# Patient Record
Sex: Male | Born: 2000 | Race: White | Hispanic: No | Marital: Single | Smoking: Current some day smoker
Health system: Southern US, Community
[De-identification: ages and names within clinical notes are randomized; demographics above are authoritative.]

## PROBLEM LIST (undated history)

## (undated) DIAGNOSIS — F902 Attention-deficit hyperactivity disorder, combined type: Secondary | ICD-10-CM

## (undated) DIAGNOSIS — R278 Other lack of coordination: Secondary | ICD-10-CM

## (undated) HISTORY — DX: Attention-deficit hyperactivity disorder, combined type: F90.2

## (undated) HISTORY — PX: TYMPANOSTOMY TUBE PLACEMENT: SHX32

## (undated) HISTORY — DX: Other lack of coordination: R27.8

---
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
## 2022-08-19 ENCOUNTER — Other Ambulatory Visit: Payer: Self-pay

## 2022-08-19 ENCOUNTER — Emergency Department (HOSPITAL_COMMUNITY): Payer: 59 | Admitting: Anesthesiology

## 2022-08-19 ENCOUNTER — Observation Stay (HOSPITAL_BASED_OUTPATIENT_CLINIC_OR_DEPARTMENT_OTHER)
Admission: EM | Admit: 2022-08-19 | Discharge: 2022-08-20 | Disposition: A | Discharge status: Home / Self Care | Payer: 59 | Attending: General Surgery | Admitting: General Surgery

## 2022-08-19 ENCOUNTER — Emergency Department (HOSPITAL_BASED_OUTPATIENT_CLINIC_OR_DEPARTMENT_OTHER): Payer: 59

## 2022-08-19 DIAGNOSIS — R1031 Right lower quadrant pain: Secondary | ICD-10-CM | POA: Diagnosis present

## 2022-08-19 DIAGNOSIS — K353 Acute appendicitis with localized peritonitis, without perforation or gangrene: Secondary | ICD-10-CM | POA: Diagnosis not present

## 2022-08-19 DIAGNOSIS — K3580 Unspecified acute appendicitis: Secondary | ICD-10-CM | POA: Diagnosis present

## 2022-08-19 LAB — CBC
HCT: 41.6 % (ref 39.0–52.0)
HCT: 41 % (ref 39.0–52.0)
Hemoglobin: 14.6 g/dL (ref 13.0–17.0)
Hemoglobin: 14.3 g/dL (ref 13.0–17.0)
MCH: 29.9 pg (ref 26.0–34.0)
MCH: 29.6 pg (ref 26.0–34.0)
MCHC: 35.1 g/dL (ref 30.0–36.0)
MCHC: 34.9 g/dL (ref 30.0–36.0)
MCV: 85.2 fL (ref 80.0–100.0)
MCV: 84.9 fL (ref 80.0–100.0)
Platelets: 307 10*3/uL (ref 150–400)
Platelets: 301 10*3/uL (ref 150–400)
RBC: 4.88 MIL/uL (ref 4.22–5.81)
RBC: 4.83 MIL/uL (ref 4.22–5.81)
RDW: 11.7 % (ref 11.5–15.5)
RDW: 11.7 % (ref 11.5–15.5)
WBC: 12.1 10*3/uL — ABNORMAL HIGH (ref 4.0–10.5)
WBC: 9.8 10*3/uL (ref 4.0–10.5)
nRBC: 0 % (ref 0.0–0.2)
nRBC: 0 % (ref 0.0–0.2)

## 2022-08-19 LAB — COMPREHENSIVE METABOLIC PANEL
ALT: 14 U/L (ref 0–44)
AST: 17 U/L (ref 15–41)
Albumin: 5 g/dL (ref 3.5–5.0)
Alkaline Phosphatase: 64 U/L (ref 38–126)
Anion gap: 10 (ref 5–15)
BUN: 15 mg/dL (ref 6–20)
CO2: 26 mmol/L (ref 22–32)
Calcium: 9.9 mg/dL (ref 8.9–10.3)
Chloride: 101 mmol/L (ref 98–111)
Creatinine, Ser: 1.05 mg/dL (ref 0.61–1.24)
GFR, Estimated: >60 mL/min (ref >60)
Glucose, Bld: 94 mg/dL (ref 70–99)
Potassium: 3.8 mmol/L (ref 3.5–5.1)
Sodium: 137 mmol/L (ref 135–145)
Total Bilirubin: 0.8 mg/dL (ref 0.3–1.2)
Total Protein: 7.8 g/dL (ref 6.5–8.1)

## 2022-08-19 LAB — URINALYSIS, ROUTINE W REFLEX MICROSCOPIC
Bilirubin Urine: NEGATIVE
Glucose, UA: NEGATIVE mg/dL
Hgb urine dipstick: NEGATIVE
Ketones, ur: NEGATIVE mg/dL
Leukocytes,Ua: NEGATIVE
Nitrite: NEGATIVE
Protein, ur: NEGATIVE mg/dL
Specific Gravity, Urine: 1.017 (ref 1.005–1.030)
pH: 7 (ref 5.0–8.0)

## 2022-08-19 LAB — CREATININE, SERUM
Creatinine, Ser: 1.1 mg/dL (ref 0.61–1.24)
GFR, Estimated: >60 mL/min (ref >60)

## 2022-08-19 LAB — LIPASE, BLOOD: Lipase: <10 U/L — ABNORMAL LOW (ref 11–51)

## 2022-08-19 LAB — HIV ANTIBODY (ROUTINE TESTING W REFLEX): HIV Screen 4th Generation wRfx: NONREACTIVE

## 2022-08-19 MED ORDER — SODIUM CHLORIDE 0.9 % IV SOLN
2.0000 g | Freq: Once | INTRAVENOUS | Status: AC
  Administered 2022-08-19: 2 g via INTRAVENOUS
  Filled 2022-08-19: qty 20

## 2022-08-19 MED ORDER — SODIUM CHLORIDE 0.9 % IV SOLN: 2.0000 g | INTRAVENOUS | Status: DC

## 2022-08-19 MED ORDER — IOHEXOL 300 MG/ML  SOLN
100.0000 mL | Freq: Once | INTRAMUSCULAR | Status: AC | PRN
  Administered 2022-08-19: 80 mL via INTRAVENOUS

## 2022-08-19 MED ORDER — ONDANSETRON HCL 4 MG/2ML IJ SOLN: 4.0000 mg | Freq: Three times a day (TID) | INTRAMUSCULAR | Status: DC | PRN

## 2022-08-19 MED ORDER — METOPROLOL TARTRATE 5 MG/5ML IV SOLN: 5.0000 mg | Freq: Four times a day (QID) | INTRAVENOUS | Status: DC | PRN

## 2022-08-19 MED ORDER — ONDANSETRON HCL 4 MG/2ML IJ SOLN
4.0000 mg | Freq: Four times a day (QID) | INTRAMUSCULAR | Status: DC | PRN
  Administered 2022-08-19: 4 mg via INTRAVENOUS
  Filled 2022-08-19: qty 2

## 2022-08-19 MED ORDER — ONDANSETRON 4 MG PO TBDP: 4.0000 mg | ORAL_TABLET | Freq: Four times a day (QID) | ORAL | Status: DC | PRN

## 2022-08-19 MED ORDER — HYDROMORPHONE HCL 1 MG/ML IJ SOLN
0.5000 mg | INTRAMUSCULAR | Status: AC | PRN
  Administered 2022-08-19: 0.5 mg via INTRAVENOUS
  Filled 2022-08-19: qty 1

## 2022-08-19 MED ORDER — METRONIDAZOLE 500 MG/100ML IV SOLN
500.0000 mg | Freq: Two times a day (BID) | INTRAVENOUS | Status: DC
  Administered 2022-08-20: 500 mg via INTRAVENOUS
  Filled 2022-08-19: qty 100

## 2022-08-19 MED ORDER — ENOXAPARIN SODIUM 40 MG/0.4ML IJ SOSY
40.0000 mg | PREFILLED_SYRINGE | INTRAMUSCULAR | Status: DC
  Administered 2022-08-19: 40 mg via SUBCUTANEOUS
  Filled 2022-08-19: qty 0.4

## 2022-08-19 MED ORDER — OXYCODONE HCL 5 MG PO TABS
5.0000 mg | ORAL_TABLET | Freq: Four times a day (QID) | ORAL | Status: DC | PRN
  Administered 2022-08-19: 5 mg via ORAL
  Filled 2022-08-19: qty 1

## 2022-08-19 MED ORDER — POLYETHYLENE GLYCOL 3350 17 G PO PACK: 17.0000 g | PACK | Freq: Every day | ORAL | Status: DC | PRN

## 2022-08-19 MED ORDER — ACETAMINOPHEN 325 MG PO TABS: 650.0000 mg | ORAL_TABLET | Freq: Four times a day (QID) | ORAL | Status: DC | PRN

## 2022-08-19 MED ORDER — METRONIDAZOLE 500 MG/100ML IV SOLN
500.0000 mg | Freq: Once | INTRAVENOUS | Status: AC
  Administered 2022-08-19: 500 mg via INTRAVENOUS
  Filled 2022-08-19: qty 100

## 2022-08-19 MED ORDER — MORPHINE SULFATE (PF) 2 MG/ML IV SOLN: 2.0000 mg | INTRAVENOUS | Status: DC | PRN

## 2022-08-19 MED ORDER — ACETAMINOPHEN 650 MG RE SUPP: 650.0000 mg | Freq: Four times a day (QID) | RECTAL | Status: DC | PRN

## 2022-08-19 MED ORDER — KCL IN DEXTROSE-NACL 20-5-0.45 MEQ/L-%-% IV SOLN
INTRAVENOUS | Status: DC
  Filled 2022-08-19: qty 1000

## 2022-08-19 NOTE — H&P (Signed)
Reason for Consult:abdominal pain Referring Provider: Rhea Bleacher  Thomas [name] is an 22 y.o. male.  HPI: 22 yo male with 1 day of abdominal pain. Pain started in the LUQ and localized to the right lower quadrant. Pain is improved with pain medication. He has vomiting earlier today. He had a few nuggets 2 hours ago.  Past Medical History:  Diagnosis Date   ADHD (attention deficit hyperactivity disorder), combined type 05/23/2015   Dysgraphia 05/23/2015    Past Surgical History:  Procedure Laterality Date   MOUTH SURGERY  Jan 2017   Oral sugery for braces, pulling up right lower canine   TYMPANOSTOMY TUBE PLACEMENT      Family History  Problem Relation Age of Onset   Hypertension Mother    Anxiety disorder Mother    ADD / ADHD Mother    ADD / ADHD Father    Hyperlipidemia Father    Hypertension Maternal Grandfather    Hypertension Paternal Grandfather     Social History:  reports that he has never smoked. He has never been exposed to tobacco smoke. He has never used smokeless tobacco. He reports current alcohol use of about 2.0 standard drinks of alcohol per week. He reports that he does not currently use drugs after having used the following drugs: Marijuana.  Allergies: No Known Allergies  Medications: I have reviewed the patient's current medications.  Results for orders placed or performed during the hospital encounter of 08/19/22 (from the past 48 hour(s))  Lipase, blood     Status: Abnormal   Collection Time: 08/19/22  3:51 PM  Result Value Ref Range   Lipase <10 (L) 11 - 51 U/L    Comment: Performed at [facility], [address], [place], [place] [address]  Comprehensive metabolic panel     Status: None   Collection Time: 08/19/22  3:51 PM  Result Value Ref Range   Sodium 137 135 - 145 mmol/L   Potassium 3.8 3.5 - 5.1 mmol/L   Chloride 101 98 - 111 mmol/L   CO2 26 22 - 32 mmol/L   Glucose, Bld 94 70 - 99 mg/dL    Comment: Glucose  reference range applies only to samples taken after fasting for at least 8 hours.   BUN 15 6 - 20 mg/dL   Creatinine, Ser 6.04 0.61 - 1.24 mg/dL   Calcium 9.9 8.9 - 54.0 mg/dL   Total Protein 7.8 6.5 - 8.1 g/dL   Albumin 5.0 3.5 - 5.0 g/dL   AST 17 15 - 41 U/L   ALT 14 0 - 44 U/L   Alkaline Phosphatase 64 38 - 126 U/L   Total Bilirubin 0.8 0.3 - 1.2 mg/dL   GFR, Estimated >98 >11 mL/min    Comment: (NOTE) Calculated using the CKD-EPI Creatinine Equation (2021)    Anion gap 10 5 - 15    Comment: Performed at [facility], [address], [place], [place] [address]  CBC     Status: Abnormal   Collection Time: 08/19/22  3:51 PM  Result Value Ref Range   WBC 12.1 (H) 4.0 - 10.5 K/uL   RBC 4.88 4.22 - 5.81 MIL/uL   Hemoglobin 14.6 13.0 - 17.0 g/dL   HCT 29.5 62.1 - 30.8 %   MCV 85.2 80.0 - 100.0 fL   MCH 29.9 26.0 - 34.0 pg   MCHC 35.1 30.0 - 36.0 g/dL   RDW 65.7 84.6 - 96.2 %   Platelets 307 150 - 400  K/uL   nRBC 0.0 0.0 - 0.2 %    Comment: Performed at [facility], [address] [place], [place] [address]  Urinalysis, Routine w reflex microscopic -Urine, Clean Catch     Status: None   Collection Time: 08/19/22  5:10 PM  Result Value Ref Range   Color, Urine YELLOW YELLOW   APPearance CLEAR CLEAR   Specific Gravity, Urine 1.017 1.005 - 1.030   pH 7.0 5.0 - 8.0   Glucose, UA NEGATIVE NEGATIVE mg/dL   Hgb urine dipstick NEGATIVE NEGATIVE   Bilirubin Urine NEGATIVE NEGATIVE   Ketones, ur NEGATIVE NEGATIVE mg/dL   Protein, ur NEGATIVE NEGATIVE mg/dL   Nitrite NEGATIVE NEGATIVE   Leukocytes,Ua NEGATIVE NEGATIVE    Comment: Performed at [facility], [address], [place], [place] [address]    CT ABDOMEN PELVIS W CONTRAST  Result Date: 08/19/2022 CLINICAL DATA:  Right lower quadrant abdominal pain. EXAM: CT ABDOMEN AND PELVIS WITH CONTRAST TECHNIQUE: Multidetector CT imaging of the abdomen and pelvis was performed  using the standard protocol following bolus administration of intravenous contrast. RADIATION DOSE REDUCTION: This exam was performed according to the departmental dose-optimization program which includes automated exposure control, adjustment of the mA and/or kV according to patient size and/or use of iterative reconstruction technique. CONTRAST:  80mL OMNIPAQUE IOHEXOL 300 MG/ML  SOLN COMPARISON:  None Available. FINDINGS: Lower chest: The visualized lung bases are clear. No intra-abdominal free air.  Small free fluid within the pelvis. Hepatobiliary: No focal liver abnormality is seen. No gallstones, gallbladder wall thickening, or biliary dilatation. Pancreas: Unremarkable. No pancreatic ductal dilatation or surrounding inflammatory changes. Spleen: Normal in size without focal abnormality. Adrenals/Urinary Tract: The adrenal glands, kidneys, visualized ureters, and urinary bladder appear unremarkable. Stomach/Bowel: There is no bowel obstruction. The appendix is mildly enlarged and inflamed measuring 11 mm in diameter. There is enhancement of the appendiceal mucosa. The appendix is located in the right lower quadrant medial and inferior to the cecum. No drainable fluid collection/abscess. No evidence of perforation. Vascular/Lymphatic: The abdominal aorta and IVC are unremarkable. No portal venous gas. Mildly enlarged right lower quadrant/pericecal lymph nodes, reactive. Reproductive: The prostate and seminal vesicles are grossly unremarkable. No pelvic mass. Other: None Musculoskeletal: Bilateral L4 pars defects. No listhesis. No acute osseous pathology. IMPRESSION: Acute appendicitis. No drainable fluid collection/abscess. Electronically Signed   By: Elgie Collard M.D.   On: 08/19/2022 18:24    Review of Systems  Constitutional: Negative.   HENT: Negative.    Eyes: Negative.   Respiratory: Negative.    Cardiovascular: Negative.   Gastrointestinal:  Positive for abdominal pain, nausea and vomiting.   Genitourinary: Negative.   Musculoskeletal: Negative.   Skin: Negative.   Neurological: Negative.   Endo/Heme/Allergies: Negative.   Psychiatric/Behavioral: Negative.      PE Blood pressure (!) 142/93, pulse 88, temperature 98.6 F (37 C), temperature source Oral, resp. rate 14, height 5\' 9"  (1.753 m), weight 81.2 kg, SpO2 100 %. Constitutional: NAD; conversant; no deformities Eyes: Moist conjunctiva; no lid lag; anicteric; PERRL Neck: Trachea midline; no thyromegaly Lungs: Normal respiratory effort; no tactile fremitus CV: RRR; no palpable thrills; no pitting edema GI: Abd soft, TTP RLQ; no palpable hepatosplenomegaly MSK: Normal gait; no clubbing/cyanosis Psychiatric: Appropriate affect; alert and oriented x3 Lymphatic: No palpable cervical or axillary lymphadenopathy Skin: No major subcutaneous nodules. Warm and dry   Assessment/Plan: 21 yo male with acute appendicitis -IV abx -pain control -Npo after midnight -OR in the morning -we  discussed the details of the procedure; that it would be done under general anesthesia, that we would attempt to do the procedure laparoscopically. That the appendix would be isolated from the large and small intestine and then ligated and removed. We discussed the reason for this is to avoid rupture and infection and resolve the pains. We discussed risks of infection, abscess, injury to intestines or urinary structures, and need for open incision. He showed good understanding and wanted to proceed.   I reviewed last 24 h vitals and pain scores, last 48 h intake and output, last 24 h labs and trends, and last 24 h imaging results.  This care required high  level of medical decision making.   De Blanch Allah Reason 08/19/2022, 8:39 PM

## 2022-08-19 NOTE — Progress Notes (Signed)
Patient brought to PACU for pre-op, surgery cancelled due to patient not being NPO. Report called to 6N and patient transported to room.

## 2022-08-19 NOTE — Anesthesia Preprocedure Evaluation (Signed)
Anesthesia Evaluation    Reviewed: Allergy & Precautions, Patient's Chart, lab work & pertinent test results, Unable to perform ROS - Chart review only  Airway        Dental   Pulmonary neg pulmonary ROS          Cardiovascular negative cardio ROS      Neuro/Psych  PSYCHIATRIC DISORDERS      negative neurological ROS     GI/Hepatic negative GI ROS, Neg liver ROS,,,  Endo/Other  negative endocrine ROS    Renal/GU negative Renal ROS     Musculoskeletal negative musculoskeletal ROS (+)    Abdominal   Peds  Hematology negative hematology ROS (+)   Anesthesia Other Findings   Reproductive/Obstetrics                             Anesthesia Physical Anesthesia Plan  ASA: 2 and emergent  Anesthesia Plan: General   Post-op Pain Management: Ofirmev IV (intra-op)*   Induction: Intravenous, Rapid sequence and Cricoid pressure planned  PONV Risk Score and Plan: 4 or greater and Ondansetron, Dexamethasone, Midazolam and Treatment may vary due to age or medical condition  Airway Management Planned: Oral ETT  Additional Equipment:   Intra-op Plan:   Post-operative Plan: Extubation in OR  Informed Consent:   Plan Discussed with:   Anesthesia Plan Comments:        Anesthesia Quick Evaluation

## 2022-08-19 NOTE — ED Notes (Signed)
Paged Dr. Sheliah Hatch to [place] [facility]

## 2022-08-19 NOTE — ED Notes (Signed)
Called [facility] to transport patient to [facility] 6N rm# [id number]

## 2022-08-19 NOTE — ED Provider Notes (Signed)
[facility] EMERGENCY DEPARTMENT AT [facility] Provider Note   CSN: [id number] Arrival date & time: 08/19/22  1529     History  Chief Complaint  Patient presents with   Abdominal Pain    Thomas [name] is a 22 y.o. male.  Patient with no significant past medical history, no past surgical history presents to the emergency department for evaluation of right lower quadrant pain.  Symptoms began yesterday.  Initially had pain and was and was supraumbilical.  He had nausea and vomiting.  Pain gradually worsened during the day yesterday.  He states that the pain moved towards the bellybutton down towards the right lower quadrant earlier today.  He has not been vomiting.  Last ate around 10 AM.  Decreased appetite today.  No fevers.  He has had some constipation, no diarrhea.  No blood in the stool.  No urinary symptoms.       Home Medications Prior to Admission medications   Medication Sig Start Date End Date Taking? Authorizing Provider  Amphetamine Sulfate (EVEKEO) 10 MG TABS Take 10-20 mg by mouth daily as needed (Classes and work). 02/22/21   Leticia Penna, NP      Allergies    Patient has no known allergies.    Review of Systems   Review of Systems  Physical Exam Updated Vital Signs BP (!) 142/93 (BP Location: Right Arm)   Pulse 88   Temp 98.6 F (37 C) (Oral)   Resp 14   Ht 5\' 9"  (1.753 m)   Wt 81.2 kg   SpO2 100%   BMI 26.43 kg/m  Physical Exam Vitals and nursing note reviewed.  Constitutional:      General: He is not in acute distress.    Appearance: He is well-developed.  HENT:     Head: Normocephalic and atraumatic.  Eyes:     General:        Right eye: No discharge.        Left eye: No discharge.     Conjunctiva/sclera: Conjunctivae normal.  Cardiovascular:     Rate and Rhythm: Normal rate and regular rhythm.     Heart sounds: Normal heart sounds.  Pulmonary:     Effort: Pulmonary effort is normal.     Breath sounds: Normal breath sounds.   Abdominal:     Palpations: Abdomen is soft.     Tenderness: There is abdominal tenderness (Moderate right lower quadrant tenderness). There is no guarding or rebound. Positive signs include Rovsing's sign and McBurney's sign. Negative signs include Murphy's sign.  Musculoskeletal:     Cervical back: Normal range of motion and neck supple.  Skin:    General: Skin is warm and dry.  Neurological:     Mental Status: He is alert.     ED Results / Procedures / Treatments   Labs (all labs ordered are listed, but only abnormal results are displayed) Labs Reviewed  LIPASE, BLOOD - Abnormal; Notable for the following components:      Result Value   Lipase <10 (*)    All other components within normal limits  CBC - Abnormal; Notable for the following components:   WBC 12.1 (*)    All other components within normal limits  COMPREHENSIVE METABOLIC PANEL  URINALYSIS, ROUTINE W REFLEX MICROSCOPIC    EKG None  Radiology CT ABDOMEN PELVIS W CONTRAST  Result Date: 08/19/2022 CLINICAL DATA:  Right lower quadrant abdominal pain. EXAM: CT ABDOMEN AND PELVIS WITH CONTRAST TECHNIQUE: Multidetector CT imaging of  the abdomen and pelvis was performed using the standard protocol following bolus administration of intravenous contrast. RADIATION DOSE REDUCTION: This exam was performed according to the departmental dose-optimization program which includes automated exposure control, adjustment of the mA and/or kV according to patient size and/or use of iterative reconstruction technique. CONTRAST:  80mL OMNIPAQUE IOHEXOL 300 MG/ML  SOLN COMPARISON:  None Available. FINDINGS: Lower chest: The visualized lung bases are clear. No intra-abdominal free air.  Small free fluid within the pelvis. Hepatobiliary: No focal liver abnormality is seen. No gallstones, gallbladder wall thickening, or biliary dilatation. Pancreas: Unremarkable. No pancreatic ductal dilatation or surrounding inflammatory changes. Spleen: Normal  in size without focal abnormality. Adrenals/Urinary Tract: The adrenal glands, kidneys, visualized ureters, and urinary bladder appear unremarkable. Stomach/Bowel: There is no bowel obstruction. The appendix is mildly enlarged and inflamed measuring 11 mm in diameter. There is enhancement of the appendiceal mucosa. The appendix is located in the right lower quadrant medial and inferior to the cecum. No drainable fluid collection/abscess. No evidence of perforation. Vascular/Lymphatic: The abdominal aorta and IVC are unremarkable. No portal venous gas. Mildly enlarged right lower quadrant/pericecal lymph nodes, reactive. Reproductive: The prostate and seminal vesicles are grossly unremarkable. No pelvic mass. Other: None Musculoskeletal: Bilateral L4 pars defects. No listhesis. No acute osseous pathology. IMPRESSION: Acute appendicitis. No drainable fluid collection/abscess. Electronically Signed   By: Elgie Collard M.D.   On: 08/19/2022 18:24    Procedures Procedures    Medications Ordered in ED Medications  cefTRIAXone (ROCEPHIN) 2 g in sodium chloride 0.9 % 100 mL IVPB (2 g Intravenous New Bag/Given 08/19/22 1849)    And  metroNIDAZOLE (FLAGYL) IVPB 500 mg (has no administration in time range)  HYDROmorphone (DILAUDID) injection 0.5 mg (has no administration in time range)  ondansetron (ZOFRAN) injection 4 mg (has no administration in time range)  iohexol (OMNIPAQUE) 300 MG/ML solution 100 mL (80 mLs Intravenous Contrast Given 08/19/22 1737)    ED Course/ Medical Decision Making/ A&P    Patient seen and examined. History obtained directly from patient. Work-up including labs, imaging, EKG ordered in triage, if performed, were reviewed.    Labs/EKG: Independently reviewed and interpreted.  This included: CBC with elevated white blood cell count at 12,000 otherwise unremarkable; CMP unremarkable; lipase normal; UA without compelling signs of infection.  Imaging: CT ordered and  pending  Medications/Fluids: Patient offered and declined pain medication  Most recent vital signs reviewed and are as follows: BP (!) 142/93 (BP Location: Right Arm)   Pulse 88   Temp 98.6 F (37 C) (Oral)   Resp 14   Ht 5\' 9"  (1.753 m)   Wt 81.2 kg   SpO2 100%   BMI 26.43 kg/m   Initial impression: Right lower quadrant pain, moderate to high concern for acute appendicitis   6:51 PM Reassessment performed. Patient appears stable.  Imaging personally visualized and interpreted including: CT of the abdomen pelvis, agree acute uncomplicated appendicitis  Reviewed pertinent lab work and imaging with patient at bedside. Questions answered.   Most current vital signs reviewed and are as follows: BP (!) 142/93 (BP Location: Right Arm)   Pulse 88   Temp 98.6 F (37 C) (Oral)   Resp 14   Ht 5\' 9"  (1.753 m)   Wt 81.2 kg   SpO2 100%   BMI 26.43 kg/m   Plan: Admit to hospital.   I discussed case with Dr. Sheliah Hatch, general surgery.  States that surgery won't be until morning.  Request n.p.o.  after midnight, IV antibiotics.                             Medical Decision Making Amount and/or Complexity of Data Reviewed Labs: ordered. Radiology: ordered.  Risk Prescription drug management. Decision regarding hospitalization.   For this patient's complaint of abdominal pain, the following conditions were considered on the differential diagnosis: gastritis/PUD, enteritis/duodenitis, appendicitis, cholelithiasis/cholecystitis, cholangitis, pancreatitis, ruptured viscus, colitis, diverticulitis, small/large bowel obstruction, proctitis, cystitis, pyelonephritis, ureteral colic, aortic dissection, aortic aneurysm. Atypical chest etiologies were also considered including ACS, PE, and pneumonia.         Final Clinical Impression(s) / ED Diagnoses Final diagnoses:  Acute appendicitis with localized peritonitis, without perforation, abscess, or gangrene    Rx / DC  Orders ED Discharge Orders     None         Renne Crigler, PA-C 08/19/22 1853    Vanetta Mulders, MD 08/22/22 1400

## 2022-08-19 NOTE — ED Triage Notes (Signed)
A+Ox4. Ambulatory to triage.  Reports abd pain starting last night. N/V- given zofran by urgent care PTA. No flank pain. Tenderness in both LQ-bloated feeling. Last BM this AM. No urinary complaints. Denies CP, SOB.  Denies medical HX.

## 2022-08-20 ENCOUNTER — Observation Stay (HOSPITAL_BASED_OUTPATIENT_CLINIC_OR_DEPARTMENT_OTHER): Payer: 59 | Admitting: Anesthesiology

## 2022-08-20 ENCOUNTER — Encounter (HOSPITAL_COMMUNITY): Payer: Self-pay

## 2022-08-20 ENCOUNTER — Encounter (HOSPITAL_COMMUNITY): Admission: EM | Disposition: A | Discharge status: Home / Self Care | Payer: Self-pay | Source: Home / Self Care

## 2022-08-20 ENCOUNTER — Observation Stay (HOSPITAL_COMMUNITY): Payer: 59 | Admitting: Anesthesiology

## 2022-08-20 DIAGNOSIS — F17210 Nicotine dependence, cigarettes, uncomplicated: Secondary | ICD-10-CM

## 2022-08-20 DIAGNOSIS — F909 Attention-deficit hyperactivity disorder, unspecified type: Secondary | ICD-10-CM | POA: Diagnosis not present

## 2022-08-20 DIAGNOSIS — K3580 Unspecified acute appendicitis: Secondary | ICD-10-CM

## 2022-08-20 HISTORY — PX: LAPAROSCOPIC APPENDECTOMY: SHX408

## 2022-08-20 LAB — CBC
HCT: 39.5 % (ref 39.0–52.0)
Hemoglobin: 13.8 g/dL (ref 13.0–17.0)
MCH: 30.4 pg (ref 26.0–34.0)
MCHC: 34.9 g/dL (ref 30.0–36.0)
MCV: 87 fL (ref 80.0–100.0)
Platelets: 282 10*3/uL (ref 150–400)
RBC: 4.54 MIL/uL (ref 4.22–5.81)
RDW: 11.7 % (ref 11.5–15.5)
WBC: 8.6 10*3/uL (ref 4.0–10.5)
nRBC: 0 % (ref 0.0–0.2)

## 2022-08-20 SURGERY — APPENDECTOMY, LAPAROSCOPIC
Anesthesia: General

## 2022-08-20 SURGERY — APPENDECTOMY, LAPAROSCOPIC
Anesthesia: General | Site: Abdomen

## 2022-08-20 MED ORDER — PROPOFOL 10 MG/ML IV BOLUS
INTRAVENOUS | Status: DC | PRN
  Administered 2022-08-20: 160 mg via INTRAVENOUS

## 2022-08-20 MED ORDER — OXYCODONE HCL 5 MG PO TABS
ORAL_TABLET | ORAL | Status: AC
  Filled 2022-08-20: qty 1

## 2022-08-20 MED ORDER — PROPOFOL 10 MG/ML IV BOLUS
INTRAVENOUS | Status: AC
  Filled 2022-08-20: qty 20

## 2022-08-20 MED ORDER — LIDOCAINE 2% (20 MG/ML) 5 ML SYRINGE
INTRAMUSCULAR | Status: AC
  Filled 2022-08-20: qty 5

## 2022-08-20 MED ORDER — HYDROMORPHONE HCL 1 MG/ML IJ SOLN: 0.2500 mg | INTRAMUSCULAR | Status: DC | PRN

## 2022-08-20 MED ORDER — FENTANYL CITRATE (PF) 100 MCG/2ML IJ SOLN
25.0000 ug | INTRAMUSCULAR | Status: DC | PRN
  Administered 2022-08-20 (×2): 50 ug via INTRAVENOUS

## 2022-08-20 MED ORDER — ONDANSETRON HCL 4 MG/2ML IJ SOLN
INTRAMUSCULAR | Status: AC
  Filled 2022-08-20: qty 2

## 2022-08-20 MED ORDER — ACETAMINOPHEN 10 MG/ML IV SOLN
INTRAVENOUS | Status: AC
  Filled 2022-08-20: qty 100

## 2022-08-20 MED ORDER — TRAMADOL HCL 50 MG PO TABS: 50.0000 mg | ORAL_TABLET | Freq: Four times a day (QID) | ORAL | 0 refills | Status: AC | PRN

## 2022-08-20 MED ORDER — 0.9 % SODIUM CHLORIDE (POUR BTL) OPTIME
TOPICAL | Status: DC | PRN
  Administered 2022-08-20: 1000 mL

## 2022-08-20 MED ORDER — ONDANSETRON HCL 4 MG/2ML IJ SOLN
INTRAMUSCULAR | Status: DC | PRN
  Administered 2022-08-20: 4 mg via INTRAVENOUS

## 2022-08-20 MED ORDER — MIDAZOLAM HCL 5 MG/5ML IJ SOLN
INTRAMUSCULAR | Status: DC | PRN
  Administered 2022-08-20: 2 mg via INTRAVENOUS

## 2022-08-20 MED ORDER — KETOROLAC TROMETHAMINE 30 MG/ML IJ SOLN
INTRAMUSCULAR | Status: AC
  Filled 2022-08-20: qty 1

## 2022-08-20 MED ORDER — SUGAMMADEX SODIUM 200 MG/2ML IV SOLN
INTRAVENOUS | Status: DC | PRN
  Administered 2022-08-20: 200 mg via INTRAVENOUS

## 2022-08-20 MED ORDER — BUPIVACAINE-EPINEPHRINE 0.25% -1:200000 IJ SOLN
INTRAMUSCULAR | Status: DC | PRN
  Administered 2022-08-20: 16 mL

## 2022-08-20 MED ORDER — FENTANYL CITRATE (PF) 250 MCG/5ML IJ SOLN
INTRAMUSCULAR | Status: DC | PRN
  Administered 2022-08-20: 50 ug via INTRAVENOUS
  Administered 2022-08-20 (×2): 100 ug via INTRAVENOUS

## 2022-08-20 MED ORDER — CHLORHEXIDINE GLUCONATE 0.12 % MT SOLN
OROMUCOSAL | Status: AC
  Filled 2022-08-20: qty 15

## 2022-08-20 MED ORDER — DEXAMETHASONE SODIUM PHOSPHATE 10 MG/ML IJ SOLN
INTRAMUSCULAR | Status: DC | PRN
  Administered 2022-08-20: 4 mg via INTRAVENOUS

## 2022-08-20 MED ORDER — FENTANYL CITRATE (PF) 250 MCG/5ML IJ SOLN
INTRAMUSCULAR | Status: AC
  Filled 2022-08-20: qty 5

## 2022-08-20 MED ORDER — PROMETHAZINE HCL 25 MG/ML IJ SOLN: 6.2500 mg | INTRAMUSCULAR | Status: DC | PRN

## 2022-08-20 MED ORDER — BUPIVACAINE-EPINEPHRINE (PF) 0.25% -1:200000 IJ SOLN
INTRAMUSCULAR | Status: AC
  Filled 2022-08-20: qty 30

## 2022-08-20 MED ORDER — KETOROLAC TROMETHAMINE 30 MG/ML IJ SOLN
INTRAMUSCULAR | Status: DC | PRN
  Administered 2022-08-20: 30 mg via INTRAVENOUS

## 2022-08-20 MED ORDER — LACTATED RINGERS IV SOLN: INTRAVENOUS | Status: DC

## 2022-08-20 MED ORDER — DEXAMETHASONE SODIUM PHOSPHATE 10 MG/ML IJ SOLN
INTRAMUSCULAR | Status: AC
  Filled 2022-08-20: qty 1

## 2022-08-20 MED ORDER — SODIUM CHLORIDE 0.9 % IR SOLN
Status: DC | PRN
  Administered 2022-08-20: 1

## 2022-08-20 MED ORDER — ROCURONIUM BROMIDE 50 MG/5ML IV SOSY
PREFILLED_SYRINGE | INTRAVENOUS | Status: DC | PRN
  Administered 2022-08-20: 20 mg via INTRAVENOUS
  Administered 2022-08-20: 40 mg via INTRAVENOUS

## 2022-08-20 MED ORDER — OXYCODONE HCL 5 MG PO TABS
5.0000 mg | ORAL_TABLET | Freq: Once | ORAL | Status: AC
  Administered 2022-08-20: 5 mg via ORAL

## 2022-08-20 MED ORDER — LIDOCAINE 2% (20 MG/ML) 5 ML SYRINGE
INTRAMUSCULAR | Status: DC | PRN
  Administered 2022-08-20: 60 mg via INTRAVENOUS

## 2022-08-20 MED ORDER — ACETAMINOPHEN 10 MG/ML IV SOLN
INTRAVENOUS | Status: DC | PRN
  Administered 2022-08-20: 1000 mg via INTRAVENOUS

## 2022-08-20 MED ORDER — MIDAZOLAM HCL 2 MG/2ML IJ SOLN
INTRAMUSCULAR | Status: AC
  Filled 2022-08-20: qty 2

## 2022-08-20 MED ORDER — FENTANYL CITRATE (PF) 100 MCG/2ML IJ SOLN
INTRAMUSCULAR | Status: AC
  Filled 2022-08-20: qty 2

## 2022-08-20 MED ORDER — ROCURONIUM BROMIDE 10 MG/ML (PF) SYRINGE
PREFILLED_SYRINGE | INTRAVENOUS | Status: AC
  Filled 2022-08-20: qty 10

## 2022-08-20 MED ORDER — ORAL CARE MOUTH RINSE
15.0000 mL | Freq: Once | OROMUCOSAL | Status: AC
  Administered 2022-08-20: 15 mL via OROMUCOSAL

## 2022-08-20 MED ORDER — CHLORHEXIDINE GLUCONATE 0.12 % MT SOLN: 15.0000 mL | Freq: Once | OROMUCOSAL | Status: AC

## 2022-08-20 MED ORDER — AMISULPRIDE (ANTIEMETIC) 5 MG/2ML IV SOLN: 10.0000 mg | Freq: Once | INTRAVENOUS | Status: DC | PRN

## 2022-08-20 MED ORDER — MEPERIDINE HCL 25 MG/ML IJ SOLN: 6.2500 mg | INTRAMUSCULAR | Status: DC | PRN

## 2022-08-20 SURGICAL SUPPLY — 45 items
APPLIER CLIP 5 13 M/L LIGAMAX5 (MISCELLANEOUS)
BAG COUNTER SPONGE SURGICOUNT (BAG) ×1 IMPLANT
BLADE CLIPPER SURG (BLADE) IMPLANT
CANISTER SUCT 3000ML PPV (MISCELLANEOUS) ×1 IMPLANT
CHLORAPREP W/TINT 26 (MISCELLANEOUS) ×1 IMPLANT
CLIP APPLIE 5 13 M/L LIGAMAX5 (MISCELLANEOUS) IMPLANT
COVER SURGICAL LIGHT HANDLE (MISCELLANEOUS) ×1 IMPLANT
CUTTER FLEX LINEAR 45M (STAPLE) ×1 IMPLANT
DERMABOND ADVANCED .7 DNX12 (GAUZE/BANDAGES/DRESSINGS) ×1 IMPLANT
ELECT REM PT RETURN 9FT ADLT (ELECTROSURGICAL) ×1
ELECTRODE REM PT RTRN 9FT ADLT (ELECTROSURGICAL) ×1 IMPLANT
GLOVE BIO SURGEON STRL SZ7.5 (GLOVE) ×1 IMPLANT
GLOVE INDICATOR 8.0 STRL GRN (GLOVE) ×1 IMPLANT
GOWN STRL REUS W/ TWL LRG LVL3 (GOWN DISPOSABLE) ×2 IMPLANT
GOWN STRL REUS W/ TWL XL LVL3 (GOWN DISPOSABLE) ×1 IMPLANT
GOWN STRL REUS W/TWL LRG LVL3 (GOWN DISPOSABLE) ×2
GOWN STRL REUS W/TWL XL LVL3 (GOWN DISPOSABLE) ×1
IRRIG SUCT STRYKERFLOW 2 WTIP (MISCELLANEOUS) ×1
IRRIGATION SUCT STRKRFLW 2 WTP (MISCELLANEOUS) ×1 IMPLANT
KIT BASIN OR (CUSTOM PROCEDURE TRAY) ×1 IMPLANT
KIT TURNOVER KIT B (KITS) ×1 IMPLANT
NS IRRIG 1000ML POUR BTL (IV SOLUTION) ×1 IMPLANT
PAD ARMBOARD 7.5X6 YLW CONV (MISCELLANEOUS) ×2 IMPLANT
PENCIL SMOKE EVACUATOR (MISCELLANEOUS) ×1 IMPLANT
RELOAD 45 VASCULAR/THIN (ENDOMECHANICALS) IMPLANT
RELOAD STAPLE 45 2.5 WHT GRN (ENDOMECHANICALS) IMPLANT
RELOAD STAPLE 45 3.5 BLU ETS (ENDOMECHANICALS) IMPLANT
RELOAD STAPLE TA45 3.5 REG BLU (ENDOMECHANICALS) ×1 IMPLANT
SCISSORS LAP 5X35 DISP (ENDOMECHANICALS) IMPLANT
SET TUBE SMOKE EVAC HIGH FLOW (TUBING) ×1 IMPLANT
SHEARS HARMONIC ACE PLUS 36CM (ENDOMECHANICALS) ×1 IMPLANT
SLEEVE ADV FIXATION 5X100MM (TROCAR) ×1 IMPLANT
SPECIMEN JAR SMALL (MISCELLANEOUS) ×1 IMPLANT
SUT MNCRL AB 4-0 PS2 18 (SUTURE) ×1 IMPLANT
SYS BAG RETRIEVAL 10MM (BASKET) ×1
SYSTEM BAG RETRIEVAL 10MM (BASKET) ×1 IMPLANT
TOWEL GREEN STERILE (TOWEL DISPOSABLE) ×1 IMPLANT
TOWEL GREEN STERILE FF (TOWEL DISPOSABLE) ×1 IMPLANT
TRAY FOLEY W/BAG SLVR 16FR (SET/KITS/TRAYS/PACK)
TRAY FOLEY W/BAG SLVR 16FR ST (SET/KITS/TRAYS/PACK) ×1 IMPLANT
TRAY LAPAROSCOPIC MC (CUSTOM PROCEDURE TRAY) ×1 IMPLANT
TROCAR ADV FIXATION 5X100MM (TROCAR) ×1 IMPLANT
TROCAR BALLN 12MMX100 BLUNT (TROCAR) ×1 IMPLANT
WARMER LAPAROSCOPE (MISCELLANEOUS) ×1 IMPLANT
WATER STERILE IRR 1000ML POUR (IV SOLUTION) ×1 IMPLANT

## 2022-08-20 NOTE — Transfer of Care (Signed)
Immediate Anesthesia Transfer of Care Note  Patient: Thomas [name]  Procedure(s) Performed: APPENDECTOMY LAPAROSCOPIC (Abdomen)  Patient Location: PACU  Anesthesia Type:General  Level of Consciousness: drowsy and patient cooperative  Airway & Oxygen Therapy: Patient Spontanous Breathing  Post-op Assessment: Report given to RN and Post -op Vital signs reviewed and stable  Post vital signs: Reviewed and stable  Last Vitals:  Vitals Value Taken Time  BP 149/102 08/20/22 1031  Temp 36.6 C 08/20/22 1030  Pulse 65 08/20/22 1037  Resp 11 08/20/22 1037  SpO2 91 % 08/20/22 1037  Vitals shown include unvalidated device data.  Last Pain:  Vitals:   08/20/22 1030  TempSrc:   PainSc: 7       Patients Stated Pain Goal: 0 (08/19/22 2357)  Complications: No notable events documented.

## 2022-08-20 NOTE — Anesthesia Procedure Notes (Signed)
Procedure Name: Intubation Date/Time: 08/20/2022 9:18 AM  Performed by: Adria Dill, CRNAPre-anesthesia Checklist: Patient identified, Emergency Drugs available, Suction available and Patient being monitored Patient Re-evaluated:Patient Re-evaluated prior to induction Oxygen Delivery Method: Circle system utilized Preoxygenation: Pre-oxygenation with 100% oxygen Induction Type: IV induction Ventilation: Mask ventilation without difficulty Laryngoscope Size: Miller and 3 Grade View: Grade I Tube type: Oral Tube size: 7.0 mm Number of attempts: 1 Airway Equipment and Method: Stylet and Oral airway Placement Confirmation: ETT inserted through vocal cords under direct vision, positive ETCO2 and breath sounds checked- equal and bilateral Secured at: 22 cm Tube secured with: Tape Dental Injury: Teeth and Oropharynx as per pre-operative assessment

## 2022-08-20 NOTE — Progress Notes (Signed)
Subjective No acute events. Still with RLQ pain. No other complaints reported  PSH: Dental surgery, denies any other surgical hx  Objective: Vital signs in last 24 hours: Temp:  [97.7 F (36.5 C)-98.6 F (37 C)] 97.7 F (36.5 C) (06/18 0801) Pulse Rate:  [52-98] 53 (06/18 0801) Resp:  [14-18] 18 (06/18 0801) BP: (103-142)/(46-93) 115/65 (06/18 0801) SpO2:  [97 %-100 %] 97 % (06/18 0801) Weight:  [81.2 kg] 81.2 kg (06/18 0801) Last BM Date : 08/19/22  Intake/Output from previous day: 06/17 0701 - 06/18 0700 In: 100 [IV Piggyback:100] Out: -  Intake/Output this shift: No intake/output data recorded.  Gen: NAD, comfortable CV: RRR Pulm: Normal work of breathing Abd: Soft, mildly ttp in RLQ; no rebound/guarding. Nondistended Ext: SCDs in place  Lab Results: CBC  Recent Labs    08/19/22 2106 08/20/22 0026  WBC 9.8 8.6  HGB 14.3 13.8  HCT 41.0 39.5  PLT 301 282   BMET Recent Labs    08/19/22 1551 08/19/22 2106  NA 137  --   K 3.8  --   CL 101  --   CO2 26  --   GLUCOSE 94  --   BUN 15  --   CREATININE 1.05 1.10  CALCIUM 9.9  --    PT/INR No results for input(s): "LABPROT", "INR" in the last 72 hours. ABG No results for input(s): "PHART", "HCO3" in the last 72 hours.  Invalid input(s): "PCO2", "PO2"  Studies/Results:  Anti-infectives: Anti-infectives (From admission, onward)    Start     Dose/Rate Route Frequency Ordered Stop   08/20/22 1800  [MAR Hold]  cefTRIAXone (ROCEPHIN) 2 g in sodium chloride 0.9 % 100 mL IVPB        (MAR Hold since Tue 08/20/2022 at 0758.Hold Reason: Transfer to a Procedural area)  See Hyperspace for full Linked Orders Report.   2 g 200 mL/hr over 30 Minutes Intravenous Every 24 hours 08/19/22 2043 08/27/22 1759   08/20/22 0800  [MAR Hold]  metroNIDAZOLE (FLAGYL) IVPB 500 mg        (MAR Hold since Tue 08/20/2022 at 0758.Hold Reason: Transfer to a Procedural area)  See Hyperspace for full Linked Orders Report.   500 mg 100  mL/hr over 60 Minutes Intravenous Every 12 hours 08/19/22 2043 08/27/22 0759   08/19/22 1830  cefTRIAXone (ROCEPHIN) 2 g in sodium chloride 0.9 % 100 mL IVPB       See Hyperspace for full Linked Orders Report.   2 g 200 mL/hr over 30 Minutes Intravenous  Once 08/19/22 1828 08/19/22 1927   08/19/22 1830  metroNIDAZOLE (FLAGYL) IVPB 500 mg       See Hyperspace for full Linked Orders Report.   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 The pathophysiology of appendicitis was discussed as well. -We reviewed options moving forward for treatment, covering IV abx vs surgery. We discussed that with antibiotics alone, there is reasonable success in managing appendicitis, however, risks of recurrence at 21yrs being as high as 40% in some studies. We discussed appendectomy -  laparoscopic and potential open techniques as well as scenarios where an ileocecectomy could be necessary. We discussed the material risks (including, but not limited to, pain, bleeding, infection, scarring, need for blood transfusion, damage to surrounding structures- blood vessels/nerves/viscus/organs, damage to ureter/bladder, leak from staple line, need for additional procedures, hernia, recurrence although quite low, pneumonia, heart attack, stroke, death) benefits and alternatives to surgery were discussed. The patient's questions were answered to his satisfaction, he voiced understanding and elected to proceed with surgery. Additionally, we discussed typical postoperative expectations and the recovery  process.  -He requested we update his mother, Scotland Carabetta, after surgery  I reviewed last 24 h vitals and pain scores, last 48 h intake and output, last 24 h labs and trends, and last 24 h imaging results.   This care required high level of medical decision making   LOS: 1 day    Marin Olp, MD [facility]

## 2022-08-20 NOTE — Discharge Instructions (Signed)
[facility] ° °Please arrive at least 30 min before your appointment to complete your check in paperwork.  If you are unable to arrive 30 min prior to your appointment time we may have to cancel or reschedule you. °LAPAROSCOPIC SURGERY: POST OP INSTRUCTIONS °Always review your discharge instruction sheet given to you by the facility where your surgery was performed. °IF YOU HAVE DISABILITY OR FAMILY LEAVE FORMS, YOU MUST BRING THEM TO THE OFFICE FOR PROCESSING.   °DO NOT GIVE THEM TO YOUR DOCTOR. ° °PAIN CONTROL ° °First take acetaminophen (Tylenol) AND/or ibuprofen (Advil) to control your pain after surgery.  Follow directions on package.  Taking acetaminophen (Tylenol) and/or ibuprofen (Advil) regularly after surgery will help to control your pain and lower the amount of prescription pain medication you may need.  You should not take more than 4,000 mg (4 grams) of acetaminophen (Tylenol) in 24 hours.  You should not take ibuprofen (Advil), aleve, motrin, naprosyn or other NSAIDS if you have a history of stomach ulcers or chronic kidney disease.  °A prescription for pain medication may be given to you upon discharge.  Take your pain medication as prescribed, if you still have uncontrolled pain after taking acetaminophen (Tylenol) or ibuprofen (Advil). °Use ice packs to help control pain. °If you need a refill on your pain medication, please contact your pharmacy.  They will contact our office to request authorization. Prescriptions will not be filled after 5pm or on week-ends. ° °HOME MEDICATIONS °Take your usually prescribed medications unless otherwise directed. ° °DIET °You should follow a light diet the first few days after arrival home.  Be sure to include lots of fluids daily. Avoid fatty, fried foods.  ° °CONSTIPATION °It is common to experience some constipation after surgery and if you are taking pain medication.  Increasing fluid intake and taking a stool softener (such as Colace)  will usually help or prevent this problem from occurring.  A mild laxative (Milk of Magnesia or Miralax) should be taken according to package instructions if there are no bowel movements after 48 hours. ° °WOUND/INCISION CARE °Most patients will experience some swelling and bruising in the area of the incisions.  Ice packs will help.  Swelling and bruising can take several days to resolve.  °Unless discharge instructions indicate otherwise, follow guidelines below  °STERI-STRIPS - you may remove your outer bandages 48 hours after surgery, and you may shower at that time.  You have steri-strips (small skin tapes) in place directly over the incision.  These strips should be left on the skin for 7-10 days.   °DERMABOND/SKIN GLUE - you may shower in 24 hours.  The glue will flake off over the next 2-3 weeks. °Any sutures or staples will be removed at the office during your follow-up visit. ° °ACTIVITIES °You may resume regular (light) daily activities beginning the next day--such as daily self-care, walking, climbing stairs--gradually increasing activities as tolerated.  You may have sexual intercourse when it is comfortable.  Refrain from any heavy lifting or straining until approved by your doctor. °You may drive when you are no longer taking prescription pain medication, you can comfortably wear a seatbelt, and you can safely maneuver your car and apply brakes. ° °FOLLOW-UP °You should see your doctor in the office for a follow-up appointment approximately 2-3 weeks after your surgery.  You should have been given your post-op/follow-up appointment when your surgery was scheduled.  If you did not receive a post-op/follow-up appointment, make sure   that you call for this appointment within a day or two after you arrive home to insure a convenient appointment time. ° ° °WHEN TO CALL YOUR DOCTOR: °Fever over 101.0 °Inability to urinate °Continued bleeding from incision. °Increased pain, redness, or drainage from the  incision. °Increasing abdominal pain ° °The clinic staff is available to answer your questions during regular business hours.  Please don’t hesitate to call and ask to speak to one of the nurses for clinical concerns.  If you have a medical emergency, go to the nearest emergency room or call 911.  A surgeon from [facility] is always on call at the hospital. °[address], [address], [place], [place]  [address] ? [address] [address], [place], [place]   [address] °[phone] ? [phone] ? FAX [phone] ° ° ° ° °Managing Your Pain After Surgery Without Opioids ° ° ° °Thank you for participating in our program to help patients manage their pain after surgery without opioids. This is part of our effort to provide you with the best care possible, without exposing you or your family to the risk that opioids pose. ° °What pain can I expect after surgery? °You can expect to have some pain after surgery. This is normal. The pain is typically worse the day after surgery, and quickly begins to get better. °Many studies have found that many patients are able to manage their pain after surgery with Over-the-Counter (OTC) medications such as Tylenol and Motrin. If you have a condition that does not allow you to take Tylenol or Motrin, notify your surgical team. ° °How will I manage my pain? °The best strategy for controlling your pain after surgery is around the clock pain control with Tylenol (acetaminophen) and Motrin (ibuprofen or Advil). Alternating these medications with each other allows you to maximize your pain control. In addition to Tylenol and Motrin, you can use heating pads or ice packs on your incisions to help reduce your pain. ° °How will I alternate your regular strength over-the-counter pain medication? °You will take a dose of pain medication every three hours. °Start by taking 650 mg of Tylenol (2 pills of 325 mg) °3 hours later take 600 mg of Motrin (3 pills of 200 mg) °3 hours after  taking the Motrin take 650 mg of Tylenol °3 hours after that take 600 mg of Motrin. ° ° °- 1 - ° °See example - if your first dose of Tylenol is at 12:00 PM ° ° °12:00 PM Tylenol 650 mg (2 pills of 325 mg)  °3:00 PM Motrin 600 mg (3 pills of 200 mg)  °6:00 PM Tylenol 650 mg (2 pills of 325 mg)  °9:00 PM Motrin 600 mg (3 pills of 200 mg)  °Continue alternating every 3 hours  ° °We recommend that you follow this schedule around-the-clock for at least 3 days after surgery, or until you feel that it is no longer needed. Use the table on the last page of this handout to keep track of the medications you are taking. °Important: °Do not take more than 3000mg of Tylenol or 3200mg of Motrin in a 24-hour period. °Do not take ibuprofen/Motrin if you have a history of bleeding stomach ulcers, severe kidney disease, &/or actively taking a blood thinner ° °What if I still have pain? °If you have pain that is not controlled with the over-the-counter pain medications (Tylenol and Motrin or Advil) you might have what we call “breakthrough” pain. You will receive a prescription   for a small amount of an opioid pain medication such as Oxycodone, Tramadol, or Tylenol with Codeine. Use these opioid pills in the first 24 hours after surgery if you have breakthrough pain. Do not take more than 1 pill every 4-6 hours. ° °If you still have uncontrolled pain after using all opioid pills, don't hesitate to call our staff using the number provided. We will help make sure you are managing your pain in the best way possible, and if necessary, we can provide a prescription for additional pain medication. ° ° °Day 1   ° °Time  °Name of Medication Number of pills taken  °Amount of Acetaminophen  °Pain Level  ° °Comments  °AM PM       °AM PM       °AM PM       °AM PM       °AM PM       °AM PM       °AM PM       °AM PM       °Total Daily amount of Acetaminophen °Do not take more than  3,000 mg per day    ° ° °Day 2   ° °Time  °Name of Medication  Number of pills °taken  °Amount of Acetaminophen  °Pain Level  ° °Comments  °AM PM       °AM PM       °AM PM       °AM PM       °AM PM       °AM PM       °AM PM       °AM PM       °Total Daily amount of Acetaminophen °Do not take more than  3,000 mg per day    ° ° °Day 3   ° °Time  °Name of Medication Number of pills taken  °Amount of Acetaminophen  °Pain Level  ° °Comments  °AM PM       °AM PM       °AM PM       °AM PM       ° ° ° °AM PM       °AM PM       °AM PM       °AM PM       °Total Daily amount of Acetaminophen °Do not take more than  3,000 mg per day    ° ° °Day 4   ° °Time  °Name of Medication Number of pills taken  °Amount of Acetaminophen  °Pain Level  ° °Comments  °AM PM       °AM PM       °AM PM       °AM PM       °AM PM       °AM PM       °AM PM       °AM PM       °Total Daily amount of Acetaminophen °Do not take more than  3,000 mg per day    ° ° °Day 5   ° °Time  °Name of Medication Number °of pills taken  °Amount of Acetaminophen  °Pain Level  ° °Comments  °AM PM       °AM PM       °AM PM       °AM PM       °AM PM       °AM   PM       °AM PM       °AM PM       °Total Daily amount of Acetaminophen °Do not take more than  3,000 mg per day    ° ° ° °Day 6   ° °Time  °Name of Medication Number of pills °taken  °Amount of Acetaminophen  °Pain Level  °Comments  °AM PM       °AM PM       °AM PM       °AM PM       °AM PM       °AM PM       °AM PM       °AM PM       °Total Daily amount of Acetaminophen °Do not take more than  3,000 mg per day    ° ° °Day 7   ° °Time  °Name of Medication Number of pills taken  °Amount of Acetaminophen  °Pain Level  ° °Comments  °AM PM       °AM PM       °AM PM       °AM PM       °AM PM       °AM PM       °AM PM       °AM PM       °Total Daily amount of Acetaminophen °Do not take more than  3,000 mg per day    ° ° ° ° °For additional information about how and where to safely dispose of unused opioid °medications - https://www.morepowerfulnc.org ° °Disclaimer: This document  contains information and/or instructional materials adapted from [facility] for the typical patient with your condition. It does not replace medical advice from your health care provider because your experience may differ from that of the °typical patient. Talk to your health care provider if you have any questions about this °document, your condition or your treatment plan. °Adapted from [facility] ° °

## 2022-08-20 NOTE — Anesthesia Preprocedure Evaluation (Signed)
Anesthesia Evaluation  Patient identified by MRN, date of birth, ID band Patient awake    Reviewed: Allergy & Precautions, NPO status , Patient's Chart, lab work & pertinent test results  Airway Mallampati: I  TM Distance: >3 FB Neck ROM: Full    Dental no notable dental hx. (+) Teeth Intact, Dental Advisory Given   Pulmonary Current Smoker and Patient abstained from smoking.   Pulmonary exam normal breath sounds clear to auscultation       Cardiovascular negative cardio ROS Normal cardiovascular exam Rhythm:Regular Rate:Normal     Neuro/Psych negative neurological ROS  negative psych ROS   GI/Hepatic negative GI ROS, Neg liver ROS,,,  Endo/Other  negative endocrine ROS    Renal/GU negative Renal ROS  negative genitourinary   Musculoskeletal negative musculoskeletal ROS (+)    Abdominal   Peds  (+) ADHD Hematology negative hematology ROS (+)   Anesthesia Other Findings   Reproductive/Obstetrics                             Anesthesia Physical Anesthesia Plan  ASA: 2  Anesthesia Plan: General   Post-op Pain Management: Ofirmev IV (intra-op)*   Induction: Intravenous  PONV Risk Score and Plan: 1 and Midazolam, Dexamethasone and Ondansetron  Airway Management Planned: Oral ETT  Additional Equipment:   Intra-op Plan:   Post-operative Plan: Extubation in OR  Informed Consent: I have reviewed the patients History and Physical, chart, labs and discussed the procedure including the risks, benefits and alternatives for the proposed anesthesia with the patient or authorized representative who has indicated his/her understanding and acceptance.     Dental advisory given  Plan Discussed with: CRNA  Anesthesia Plan Comments:        Anesthesia Quick Evaluation

## 2022-08-20 NOTE — Op Note (Signed)
Thomas [name] [id number]   PRE-OPERATIVE DIAGNOSIS:  Acute appendicitis  POST-OPERATIVE DIAGNOSIS:  Acute appendicitis without perforation or gangrene  PROCEDURE: Laparoscopic appendectomy  SURGEON:  Marin Olp, MD FACS  ASSISTANT: Margarito Courser, MD PGY-3  ANESTHESIA: General endotracheal  EBL:   5 mL  DRAINS: None  SPECIMEN:  Appendix  COUNTS:  Sponge, needle and instrument counts were reported correct x2 at conclusion of the operation  DISPOSITION:  PACU in satisfactory condition  COMPLICATIONS: None  FINDINGS: Acutely inflamed/infected appearing appendix.  No evidence of perforation or abscess.  Appendectomy carried out uneventfully.  DESCRIPTION:   The patient was identified & brought into the operating room. SCDs were in place and functioning. General endotracheal anesthesia was administered. Preoperative antibiotics were administered. The patient was positioned supine with left arm tucked. Hair on the abdomen was then clipped by the OR team. The abdomen was prepped and draped in the standard sterile fashion. A surgical timeout confirmed our plan.  A small incision was made in the infraumbilical skin. The subcutaneous tissue was dissected and the umbilical stalk identified. The stalk was grasped with a Kocher and retracted outwardly. The infraumbilical fascia was exposed and incised. Peritoneal entry was carefully made bluntly. A 0 Vicryl purse-string suture was placed and then the [facility] port was introduced into the abdomen.  CO2 insufflation commenced to . The laparoscope was inserted and confirmed no evidence of trocar site complications. The patient was then positioned in Trendelenburg.  The bladder is empty.  Two additional ports were placed - one in left lower quadrant and another in the suprapubic midline taking care to stay well above the bladder - 3 fingerbreadths above the pubic symphysis and directing this port cephalad. The bed was then slightly tilted to  place the left side down.  The appendix is identified in the right lower quadrant after reflecting the omentum. The appendix is carefully freed from surrounding structures bluntly without difficulty. Care was taken to avoid injuring any retroperitoneal structures. The appendix was elevated.  The base of the appendix was circumferentially dissected taking care to preserve the cecum free of injury. The base was noted to be viable and healthy appearing. The terminal ileum, cecum and ascending colon also appeared normal. The mesoappendix is then divided using the harmonic scalpel, maintaining a plane close to the appendix and terminating at our previously created window. The cut edge of the mesoappendix is observed and hemostatic. The base of the appendix was then stapled with a blue load, taking it flush with the cecum, but also steering clear of the ileocecal valve. The appendix was placed in an EndoBag and removed from the umbilical port site and passed off as specimen.  The right lower quadrant was conservatively irrigated. Hemostasis was noted to be achieved - taking time to inspect the ligated mesoappendix. Staple line was noted to be intact on the cecum with well formed staples and no bleeding. The right lower quadrant appeared clean and as such, no drain was placed.  The left lower quadrant and suprapubic ports were removed under direct visualization. The CO2 was exhausted from the abdomen. The umbilical fascia was then closed by tieing the 0 Vicryl suture, obliterating the fascial defect. The fascia was then palpated and noted to be completely closed. The skin of all port sites was approximated using 4-0 Monocryl suture. The abdomen is then washed and dried. The incisions are covered with Dermabond.  He was then awakened from anesthesia, extubated, and transferred to a stretcher for transport to  PACU in satisfactory condition.

## 2022-08-20 NOTE — Anesthesia Postprocedure Evaluation (Signed)
Anesthesia Post Note  Patient: Thomas [name]  Procedure(s) Performed: APPENDECTOMY LAPAROSCOPIC (Abdomen)     Patient location during evaluation: PACU Anesthesia Type: General Level of consciousness: awake and alert Pain management: pain level controlled Vital Signs Assessment: post-procedure vital signs reviewed and stable Respiratory status: spontaneous breathing, nonlabored ventilation, respiratory function stable and patient connected to nasal cannula oxygen Cardiovascular status: blood pressure returned to baseline and stable Postop Assessment: no apparent nausea or vomiting Anesthetic complications: no  No notable events documented.  Last Vitals:  Vitals:   08/20/22 1115 08/20/22 1121  BP: 118/67 117/69  Pulse: (!) 52 (!) 45  Resp: 11 16  Temp:  36.5 C  SpO2: 94% 94%    Last Pain:  Vitals:   08/20/22 1100  TempSrc:   PainSc: Asleep                 Lovelyn Sheeran L Khalif Stender

## 2022-08-20 NOTE — Discharge Summary (Signed)
    Patient ID: Thomas [name] [id number] 2000/08/02 22 y.o.  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 Pain started in the LUQ and localized to the right lower quadrant. Pain is improved with pain medication. He has vomiting earlier today. He had a few nuggets 2 hours ago.   Procedures Lap appy, Dr. Cliffton Asters 6/18  Hospital Course:  The patient was admitted and underwent a laparoscopic appendectomy.  The patient tolerated the procedure well.  The patient was discharged home from PACU in stable condition.  Allergies as of 08/20/2022   No Known Allergies      Medication List     TAKE these medications    Amphetamine Sulfate 10 MG Tabs Commonly known as: Evekeo Take 10-20 mg by mouth daily as needed (Classes and work).   traMADol 50 MG tablet Commonly known as: Ultram Take 1 tablet (50 mg total) by mouth every 6 (six) hours as needed for up to 5 days (postop pain not controlled with tylenol and ibuprofen first).          Follow-up Information     Maczis, Hedda Slade, PA-C Follow up on 09/10/2022.   Specialty: General Surgery Why: 10am, Arrive 30 minutes prior to your appointment time, Please bring your insurance card and photo ID Contact information: [address] [place] [address] [facility] [place] [address] [phone]                 Signed: Barnetta Chapel, [facility] 08/20/2022, 4:21 PM Please see Amion for pager number during day hours 7:00am-4:30pm, 7-11:30am on Weekends

## 2022-08-21 ENCOUNTER — Encounter (HOSPITAL_COMMUNITY): Payer: Self-pay | Admitting: Surgery

## 2022-08-21 LAB — SURGICAL PATHOLOGY
